# Patient Record
Sex: Female | Born: 1970 | Race: White | Hispanic: No | Marital: Married | Smoking: Never smoker
Health system: Southern US, Community
[De-identification: ages and names within clinical notes are randomized; demographics above are authoritative.]

## PROBLEM LIST (undated history)

## (undated) DIAGNOSIS — K2281 Esophageal polyp: Secondary | ICD-10-CM

## (undated) DIAGNOSIS — Z8719 Personal history of other diseases of the digestive system: Secondary | ICD-10-CM

## (undated) DIAGNOSIS — K228 Other specified diseases of esophagus: Secondary | ICD-10-CM

## (undated) DIAGNOSIS — Z86010 Personal history of colonic polyps: Secondary | ICD-10-CM

## (undated) HISTORY — DX: Other specified diseases of esophagus: K22.8

## (undated) HISTORY — PX: ENDOMETRIAL ABLATION: SHX621

## (undated) HISTORY — DX: Personal history of colonic polyps: Z86.010

## (undated) HISTORY — PX: NASAL SINUS SURGERY: SHX719

## (undated) HISTORY — DX: Esophageal polyp: K22.81

## (undated) HISTORY — DX: Personal history of other diseases of the digestive system: Z87.19

---

## 2004-12-11 ENCOUNTER — Emergency Department (HOSPITAL_COMMUNITY): Admission: EM | Admit: 2004-12-11 | Discharge: 2004-12-12 | Payer: Self-pay | Admitting: Emergency Medicine

## 2006-02-25 ENCOUNTER — Other Ambulatory Visit: Admission: RE | Admit: 2006-02-25 | Discharge: 2006-02-25 | Payer: Self-pay | Admitting: Obstetrics and Gynecology

## 2007-02-02 ENCOUNTER — Encounter: Admission: RE | Admit: 2007-02-02 | Discharge: 2007-02-02 | Payer: Self-pay | Admitting: Gastroenterology

## 2008-05-31 ENCOUNTER — Other Ambulatory Visit: Admission: RE | Admit: 2008-05-31 | Discharge: 2008-05-31 | Payer: Self-pay | Admitting: Obstetrics and Gynecology

## 2008-10-18 ENCOUNTER — Ambulatory Visit (HOSPITAL_COMMUNITY): Admission: RE | Admit: 2008-10-18 | Discharge: 2008-10-18 | Payer: Self-pay | Admitting: Internal Medicine

## 2009-06-16 ENCOUNTER — Other Ambulatory Visit: Admission: RE | Admit: 2009-06-16 | Discharge: 2009-06-16 | Payer: Self-pay | Admitting: Obstetrics and Gynecology

## 2010-05-13 ENCOUNTER — Encounter: Payer: Self-pay | Admitting: Obstetrics and Gynecology

## 2010-06-18 ENCOUNTER — Other Ambulatory Visit (HOSPITAL_COMMUNITY)
Admission: RE | Admit: 2010-06-18 | Discharge: 2010-06-18 | Disposition: A | Discharge status: Home / Self Care | Payer: BC Managed Care – PPO | Source: Ambulatory Visit | Attending: Obstetrics and Gynecology | Admitting: Obstetrics and Gynecology

## 2010-06-18 DIAGNOSIS — Z01419 Encounter for gynecological examination (general) (routine) without abnormal findings: Secondary | ICD-10-CM | POA: Insufficient documentation

## 2011-06-19 ENCOUNTER — Other Ambulatory Visit (HOSPITAL_COMMUNITY)
Admission: RE | Admit: 2011-06-19 | Discharge: 2011-06-19 | Disposition: A | Discharge status: Home / Self Care | Payer: BC Managed Care – PPO | Source: Ambulatory Visit | Attending: Obstetrics and Gynecology | Admitting: Obstetrics and Gynecology

## 2011-06-19 DIAGNOSIS — Z01419 Encounter for gynecological examination (general) (routine) without abnormal findings: Secondary | ICD-10-CM | POA: Insufficient documentation

## 2012-10-15 ENCOUNTER — Other Ambulatory Visit (HOSPITAL_COMMUNITY)
Admission: RE | Admit: 2012-10-15 | Discharge: 2012-10-15 | Disposition: A | Discharge status: Home / Self Care | Payer: BC Managed Care – PPO | Source: Ambulatory Visit | Attending: Obstetrics and Gynecology | Admitting: Obstetrics and Gynecology

## 2012-10-15 ENCOUNTER — Other Ambulatory Visit: Payer: Self-pay | Admitting: Obstetrics and Gynecology

## 2012-10-15 DIAGNOSIS — Z1151 Encounter for screening for human papillomavirus (HPV): Secondary | ICD-10-CM | POA: Insufficient documentation

## 2012-10-15 DIAGNOSIS — Z01419 Encounter for gynecological examination (general) (routine) without abnormal findings: Secondary | ICD-10-CM | POA: Insufficient documentation

## 2012-11-18 ENCOUNTER — Ambulatory Visit (INDEPENDENT_AMBULATORY_CARE_PROVIDER_SITE_OTHER): Payer: BC Managed Care – PPO | Admitting: Physician Assistant

## 2012-11-18 ENCOUNTER — Encounter: Payer: Self-pay | Admitting: Physician Assistant

## 2012-11-18 VITALS — BP 124/86 | HR 76 | Temp 98.0°F | Resp 18 | Ht 69.5 in | Wt 193.0 lb

## 2012-11-18 DIAGNOSIS — F411 Generalized anxiety disorder: Secondary | ICD-10-CM

## 2012-11-18 DIAGNOSIS — R1013 Epigastric pain: Secondary | ICD-10-CM

## 2012-11-18 LAB — CBC WITH DIFFERENTIAL/PLATELET
Basophils Absolute: 0 10*3/uL (ref 0.0–0.1)
Basophils Relative: 0 % (ref 0–1)
Eosinophils Absolute: 0 10*3/uL (ref 0.0–0.7)
HCT: 42 % (ref 36.0–46.0)
Lymphs Abs: 1.9 10*3/uL (ref 0.7–4.0)
MCHC: 34.8 g/dL (ref 30.0–36.0)
Monocytes Absolute: 0.4 10*3/uL (ref 0.1–1.0)
Neutrophils Relative %: 70 % (ref 43–77)
RBC: 4.94 MIL/uL (ref 3.87–5.11)
RDW: 13.6 % (ref 11.5–15.5)

## 2012-11-18 LAB — AMYLASE: Amylase: 43 U/L (ref 0–105)

## 2012-11-18 LAB — COMPLETE METABOLIC PANEL WITH GFR
AST: 15 U/L (ref 0–37)
BUN: 6 mg/dL (ref 6–23)
Calcium: 9.3 mg/dL (ref 8.4–10.5)
Sodium: 138 mEq/L (ref 135–145)
Total Protein: 6.8 g/dL (ref 6.0–8.3)

## 2012-11-18 MED ORDER — ALPRAZOLAM 0.5 MG PO TABS: 0.5000 mg | ORAL_TABLET | Freq: Two times a day (BID) | ORAL | Status: DC

## 2012-11-18 MED ORDER — OMEPRAZOLE 40 MG PO CPDR: 40.0000 mg | DELAYED_RELEASE_CAPSULE | Freq: Every day | ORAL | Status: DC

## 2012-11-18 NOTE — Progress Notes (Signed)
Patient ID: Julia Wu MRN: [id number], DOB: 08/31/1970, 42 y.o. Date of Encounter: @DATE @  Chief Complaint:  Chief Complaint  Patient presents with  . stomach issues  alot of pain, loose bowels    x 2 weeks  and no appetite    HPI: 42 y.o. year old white (very tanned today!) female  presents with c/o abdominal pain.   Says started having some "issue" 3 weeks ago-stools have been loose-not diarrhea, but just loose. Then, for past 2 weeks, has had constant pain in epigastric area. Says it feels "raw in there".Also describes as "burning pain".  Pain is constant and does not get worse before or after she eats. Getting worse over past week.  No "heartburn" in chest. No feeling/tasting acid in throat. No vomit. Occasional nausea. Has decrased eating sec to pain even though food does not worsen the pain. No melena, hematochezia. LOV with GI was 3 yr ago-had colonoscopy then and maybe another EGD then too-pt not sure-sees [facility]  Has had high stress at home-does not go into details. Says years ago went through "rough time" and "doctors gave her daily meds-did not agree with her"-does not want SSRI. Has old bottle of Xanax Rxed 5 years ago-says she would like refill to have to use prn.  Also running 3 miles a day.  Has used NSAID about 2 per week but no more than that.  Has been drinking NO alcohol.    Past Medical History  Diagnosis Date  . History of colitis   . Hx of colonic polyps   . Esophageal polyp      Home Meds: See attached medication section for current medication list. Any medications entered into computer today will not appear on this note's list. The medications listed below were entered prior to today. No current outpatient prescriptions on file prior to visit.   No current facility-administered medications on file prior to visit.    Allergies:  Allergies  Allergen Reactions  . Penicillins Shortness Of Breath and Swelling  . Sulfa Antibiotics Hives    History    Social History  . Marital Status: Married    Spouse Name: N/A    Number of Children: N/A  . Years of Education: N/A   Occupational History  . Not on file.   Social History Main Topics  . Smoking status: Never Smoker   . Smokeless tobacco: Never Used  . Alcohol Use: No  . Drug Use: No  . Sexually Active: Not on file   Other Topics Concern  . Not on file   Social History Narrative  . No narrative on file    Family History  Problem Relation Age of Onset  . Heart disease Sister     pacemaker     Review of Systems:  See HPI for pertinent ROS. All other ROS negative.    Physical Exam: Blood pressure 124/86, pulse 76, temperature 98 F (36.7 C), temperature source Oral, resp. rate 18, height 5' 9.5" (1.765 m), weight 193 lb (87.544 kg), last menstrual period 09/18/2012., Body mass index is 28.1 kg/(m^2). General: Very Tanned today!! WF Appears in no acute distress.Appears in NO pain. Lungs: Clear bilaterally to auscultation without wheezes, rales, or rhonchi. Breathing is unlabored. Heart: RRR with S1 S2. No murmurs, rubs, or gallops. Abdomen: Soft,  non-distended with normoactive bowel sounds. No hepatomegaly. No rebound/guarding. No obvious abdominal masses. Tender to palpation midline from umbilicus to epigastric region. No Tenderness with palpation RUQ at all. Not even  right of midline whatsoever.  Musculoskeletal:  Strength and tone normal for age. Extremities/Skin: Warm and dry. No clubbing or cyanosis. No edema. No rashes or suspicious lesions. Neuro: Alert and oriented X 3. Moves all extremities spontaneously. Gait is normal. CNII-XII grossly in tact. Psych:  Responds to questions appropriately with a normal affect.     ASSESSMENT AND PLAN:  42 y.o. year old female with  1. Abdominal pain, epigastric Avoid all Nsaids. Stay off alcohol. Start omeprazole. Check labs.  Take Xanax to reduce stress, as this may be underlying cause of her symptoms. Symptoms c/w  gastritis/duodenitis, could even be ulcer. Start PPI, xanax, check labs. May need f/u with GI if symptoms not improved in 1-2 weeks. - omeprazole (PRILOSEC) 40 MG capsule; Take 1 capsule (40 mg total) by mouth daily.  Dispense: 30 capsule; Refill: 3 - CBC with Differential - COMPLETE METABOLIC PANEL WITH GFR - Amylase - Lipase - Helicobacter pylori abs-IgG+IgA, bld - Celiac panel 10  2. Anxiety state, unspecified - ALPRAZolam (XANAX) 0.5 MG tablet; Take 1 tablet (0.5 mg total) by mouth 2 (two) times daily.  Dispense: 60 tablet; Refill: 0   Signed, [address] [place], [place], [facility] 11/18/2012 9:55 AM

## 2012-11-19 LAB — CELIAC PANEL 10
Endomysial Screen: NEGATIVE
Gliadin IgG: 4.9 U/mL (ref <20)
IgA: 59 mg/dL — ABNORMAL LOW (ref 69–380)

## 2012-11-20 ENCOUNTER — Telehealth: Payer: Self-pay | Admitting: Family Medicine

## 2012-11-20 LAB — HELICOBACTER PYLORI ABS-IGG+IGA, BLD
H Pylori IgG: <0.4 {ISR}
HELICOBACTER PYLORI AB, IGA: 0.3 U/mL (ref <9.0)

## 2012-11-20 NOTE — Telephone Encounter (Signed)
Pt aware of normal labs.  States feeling much better on medications started.

## 2012-11-20 NOTE — Telephone Encounter (Signed)
Message copied by Donne Anon on Fri Nov 20, 2012  3:37 PM ------      Message from: Allayne Butcher      Created: Fri Nov 20, 2012 12:00 PM       See OV note-Pt c/o pain for 3 weeks-on exam, had pain with palpation midline from umbilicus to epigastrium. She described as "raw" pain. Sounds like gastritis/duodenitis. Also was having high stress. I Rxed omeprazole and xanax.       Tell her all labs normal. Find out if symptoms any better with addition of meds. If better, just cont to monitor. If no better, would refer to GI. I think she needs EGD. I think this would be more likely to show a diagnosis than a CT. ------

## 2013-01-18 ENCOUNTER — Ambulatory Visit (INDEPENDENT_AMBULATORY_CARE_PROVIDER_SITE_OTHER): Payer: BC Managed Care – PPO | Admitting: *Deleted

## 2013-01-18 DIAGNOSIS — Z111 Encounter for screening for respiratory tuberculosis: Secondary | ICD-10-CM

## 2013-01-20 ENCOUNTER — Ambulatory Visit: Payer: BC Managed Care – PPO | Admitting: Family Medicine

## 2013-01-20 DIAGNOSIS — Z111 Encounter for screening for respiratory tuberculosis: Secondary | ICD-10-CM

## 2013-01-20 LAB — TB SKIN TEST
Induration: 0 mm
TB Skin Test: NEGATIVE

## 2013-01-20 NOTE — Progress Notes (Signed)
Patient ID: Julia Wu, female   DOB: 1970/07/22, 42 y.o.   MRN: [id number] Patient here to have TB Skin Test read.  Patient had no reaction to PPD Skin test.  Patient given result report.

## 2013-02-02 ENCOUNTER — Telehealth: Payer: Self-pay | Admitting: Physician Assistant

## 2013-02-02 DIAGNOSIS — F411 Generalized anxiety disorder: Secondary | ICD-10-CM

## 2013-02-02 NOTE — Telephone Encounter (Addendum)
Patient needs a refill on her Xanax  O.5 mg . Patient has made appointment to be seen on Friday Oct 17 , 2014.  Patient ran out today.

## 2013-02-02 NOTE — Telephone Encounter (Signed)
Ok to refill 

## 2013-02-03 MED ORDER — ALPRAZOLAM 0.5 MG PO TABS: 0.5000 mg | ORAL_TABLET | Freq: Two times a day (BID) | ORAL | Status: DC

## 2013-02-03 NOTE — Telephone Encounter (Signed)
rx called in

## 2013-02-03 NOTE — Telephone Encounter (Signed)
Approved for one refill.

## 2013-02-05 ENCOUNTER — Ambulatory Visit (INDEPENDENT_AMBULATORY_CARE_PROVIDER_SITE_OTHER): Payer: BC Managed Care – PPO | Admitting: Family Medicine

## 2013-02-05 VITALS — BP 128/70 | HR 78 | Temp 98.0°F | Resp 20 | Wt 186.0 lb

## 2013-02-05 DIAGNOSIS — F411 Generalized anxiety disorder: Secondary | ICD-10-CM

## 2013-02-05 DIAGNOSIS — F329 Major depressive disorder, single episode, unspecified: Secondary | ICD-10-CM

## 2013-02-05 DIAGNOSIS — Z23 Encounter for immunization: Secondary | ICD-10-CM

## 2013-02-05 MED ORDER — ESCITALOPRAM OXALATE 10 MG PO TABS: 10.0000 mg | ORAL_TABLET | Freq: Every day | ORAL | Status: DC

## 2013-02-05 NOTE — Patient Instructions (Signed)
Start the lexapro once a day  Continue xanax as needed   F/U 4 weeks

## 2013-02-07 ENCOUNTER — Encounter: Payer: Self-pay | Admitting: Family Medicine

## 2013-02-07 DIAGNOSIS — F411 Generalized anxiety disorder: Secondary | ICD-10-CM | POA: Insufficient documentation

## 2013-02-07 DIAGNOSIS — F329 Major depressive disorder, single episode, unspecified: Secondary | ICD-10-CM | POA: Insufficient documentation

## 2013-02-07 NOTE — Assessment & Plan Note (Signed)
STart lexapro 10mg , Discussed medication and side effects

## 2013-02-07 NOTE — Progress Notes (Signed)
  Subjective:    Patient ID: Julia Wu, female    DOB: [birth year], 42 y.o.   MRN: [id number]  HPI   Pt here with stress and depression symptoms. Seen by PCP a few months ago, having increased stress due to pending divorce from husband. 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 She is not sleeping well, finds herself crying over little things. Her mind races at night and her sleep is poor. She continues to exercise/run this helps her stress levels, also has supportive family. She has been using xanax very sparingly, she is worried about overuse and becoming addicted to this medication and decided to come in try something a little safer for her depression and anxiety symptoms.   Review of Systems - per above  GEN- denies fatigue, fever, weight loss,weakness, recent illness CVS- denies chest pain, palpitations RESP- denies SOB, cough, wheeze Neuro- denies headache, dizziness, syncope, seizure activity       Objective:   Physical Exam  GEN-NAD alert and oriented x 3 Psych- normal affect and mood, not overly depressed, well groomed, good eye contact, no apparent hallucinations, normal speech, no SI      Assessment & Plan:

## 2013-02-07 NOTE — Assessment & Plan Note (Signed)
STart lexapro 10mg , Discussed medication and side effects Okay to overlap with benzo, will plan to decrease use of benzo and pt agrees Continue exercise and healthy diet

## 2013-02-16 ENCOUNTER — Telehealth: Payer: Self-pay | Admitting: Physician Assistant

## 2013-02-16 NOTE — Telephone Encounter (Signed)
Patient needs her most recent TB test results .    [facility].

## 2013-04-29 ENCOUNTER — Ambulatory Visit (INDEPENDENT_AMBULATORY_CARE_PROVIDER_SITE_OTHER): Payer: BC Managed Care – PPO | Admitting: Physician Assistant

## 2013-04-29 ENCOUNTER — Encounter: Payer: Self-pay | Admitting: Physician Assistant

## 2013-04-29 VITALS — BP 134/92 | HR 68 | Temp 98.3°F | Resp 18 | Wt 181.0 lb

## 2013-04-29 DIAGNOSIS — B9689 Other specified bacterial agents as the cause of diseases classified elsewhere: Secondary | ICD-10-CM

## 2013-04-29 DIAGNOSIS — F411 Generalized anxiety disorder: Secondary | ICD-10-CM

## 2013-04-29 DIAGNOSIS — J988 Other specified respiratory disorders: Secondary | ICD-10-CM

## 2013-04-29 DIAGNOSIS — F329 Major depressive disorder, single episode, unspecified: Secondary | ICD-10-CM

## 2013-04-29 DIAGNOSIS — A499 Bacterial infection, unspecified: Secondary | ICD-10-CM

## 2013-04-29 MED ORDER — AZITHROMYCIN 250 MG PO TABS: ORAL_TABLET | ORAL | Status: DC

## 2013-04-29 MED ORDER — SERTRALINE HCL 50 MG PO TABS: 50.0000 mg | ORAL_TABLET | Freq: Every day | ORAL | Status: DC

## 2013-04-29 MED ORDER — ALPRAZOLAM 0.5 MG PO TABS: 0.5000 mg | ORAL_TABLET | Freq: Two times a day (BID) | ORAL | Status: DC

## 2013-04-29 NOTE — Progress Notes (Signed)
Patient ID: Julia Wu MRN: [id number], DOB: Aug 21, 1970, 43 y.o. Date of Encounter: @DATE @  Chief Complaint:  Chief Complaint  Patient presents with  . chronic sinus problem  . Medication Refill    HPI: 43 y.o. year old white female  presents with:  She says that she's been sick for at least 2 weeks now. Says it started with some sore throat and "cold symptoms". Says she was getting a lot of drainage out of her nose. Says now nothing is coming out that she is now having a lot of pressure in her sinuses. Also the sore throat his back. Says that she did have some congestion in her chest but that is resolved. She's had no been fever. Does a lot of sinus pain and pressure.  As well, she says that she has been going through a lot of stress and problems at home. She says that she had seen me about this in the past and was using Xanax as needed. She says that this definitely helps but she realized that she was going to need something more long-term and did not want to keep using so much Xanax. Says that she ended up coming in to be seen on a diet I was not here and saw Dr. Jeanice Lim. Prescribed Lexapro 10 mg. Patient says that she took this for about 2 weeks and then had to stop it. She says that it caused her "to feel like her brain was busy." Says that she "did not feel normal".  Says that she has never been on any other SSRI.  She is out of Xanax and needs a refill. Says that these help but that she still knows that she needs to get on some other type of medicine to use more long-term.     Past Medical History  Diagnosis Date  . History of colitis   . Hx of colonic polyps   . Esophageal polyp      Home Meds: See attached medication section for current medication list. Any medications entered into computer today will not appear on this note's list. The medications listed below were entered prior to today. Current Outpatient Prescriptions on File Prior to Visit  Medication Sig Dispense Refill   . MICROGESTIN 1-20 MG-MCG tablet Take 1 tablet by mouth daily.      Marland Kitchen omeprazole (PRILOSEC) 40 MG capsule Take 1 capsule (40 mg total) by mouth daily.  30 capsule  3   No current facility-administered medications on file prior to visit.    Allergies:  Allergies  Allergen Reactions  . Penicillins Shortness Of Breath and Swelling  . Lexapro [Escitalopram Oxalate]     Caused increased "racing in brain"  . Sulfa Antibiotics Hives    History   Social History  . Marital Status: Married    Spouse Name: N/A    Number of Children: N/A  . Years of Education: N/A   Occupational History  . Not on file.   Social History Main Topics  . Smoking status: Never Smoker   . Smokeless tobacco: Never Used  . Alcohol Use: No  . Drug Use: No  . Sexual Activity: Not on file   Other Topics Concern  . Not on file   Social History Narrative  . No narrative on file    Family History  Problem Relation Age of Onset  . Heart disease Sister     pacemaker     Review of Systems:  See HPI for pertinent ROS. All  other ROS negative.    Physical Exam: Blood pressure 134/92, pulse 68, temperature 98.3 F (36.8 C), temperature source Oral, resp. rate 18, weight 181 lb (82.101 kg)., Body mass index is 26.35 kg/(m^2). General: WNWD WF. Appears in no acute distress. Head: Normocephalic, atraumatic, eyes without discharge, sclera non-icteric, nares are without discharge. Bilateral auditory canals clear, TM's are without perforation, pearly grey and translucent with reflective cone of light bilaterally. Oral cavity moist, posterior pharynx without exudate, erythema, peritonsillar abscess. The lateral maxillary and frontal sinuses are tender with percussion. She says that the worst of the pressure is at the bridge of her nose.  Neck: Supple. No thyromegaly. No lymphadenopathy. Lungs: Clear bilaterally to auscultation without wheezes, rales, or rhonchi. Breathing is unlabored. Heart: RRR with S1 S2. No  murmurs, rubs, or gallops. Musculoskeletal:  Strength and tone normal for age. Extremities/Skin: Warm and dry. No clubbing or cyanosis. No edema. No rashes or suspicious lesions. Neuro: Alert and oriented X 3. Moves all extremities spontaneously. Gait is normal. CNII-XII grossly in tact. Psych:  Responds to questions appropriately with a normal affect. She is very pleasant and has a normal mood andaffect today during visit.     ASSESSMENT AND PLAN:  43 y.o. year old female with  1. GAD (generalized anxiety disorder) - sertraline (ZOLOFT) 50 MG tablet; Take 1 tablet (50 mg total) by mouth daily.  Dispense: 30 tablet; Refill: 3  2. Major depressive disorder, single episode - sertraline (ZOLOFT) 50 MG tablet; Take 1 tablet (50 mg total) by mouth daily.  Dispense: 30 tablet; Refill: 3  3. Anxiety state, unspecified - ALPRAZolam (XANAX) 0.5 MG tablet; Take 1 tablet (0.5 mg total) by mouth 2 (two) times daily.  Dispense: 60 tablet; Refill: 0  4. Bacterial respiratory infection He has allergy to penicillin and sulfa. She says that azithromycin usually does work well for her. - azithromycin (ZITHROMAX) 250 MG tablet; Day 1: take 2 daily.  Days 2-5: Take 1 daily.  Dispense: 6 tablet; Refill: 0  Discuss that, unfortunately, it is trial and error to determine which SSRI works well/which one does not cause adverse effects for each person.  Also discussed again that it takes weeks for the medicine to build up in system to take effect. She is to schedule followup office visit with me in 6 weeks. If she develops any adverse affects to the Zoloft, then  she should go ahead and call me immediately. Otherwise, if there are no adverse effects and she is to continue taking the medication daily until her followup visit in 6 weeks. Continue to use Xanax as needed in the interim. She knows to limit this medication as much as possible.  F/u immediately if her mood or symptoms worsen or if there is any adverse  effects.  [address]igned, Mary Beth [place], [place], [facility] 04/29/2013 3:51 PM

## 2013-06-09 ENCOUNTER — Ambulatory Visit: Payer: BC Managed Care – PPO | Admitting: Physician Assistant

## 2013-07-09 ENCOUNTER — Other Ambulatory Visit: Payer: Self-pay | Admitting: Physician Assistant

## 2013-07-09 DIAGNOSIS — F411 Generalized anxiety disorder: Secondary | ICD-10-CM

## 2013-07-09 MED ORDER — ALPRAZOLAM 0.5 MG PO TABS: 0.5000 mg | ORAL_TABLET | Freq: Two times a day (BID) | ORAL | Status: DC

## 2013-07-09 NOTE — Telephone Encounter (Signed)
Called in one additional refill.  Called patient and told this is last refill until makes follow up appt as instructed.

## 2013-07-09 NOTE — Telephone Encounter (Signed)
Agree. Approve.

## 2013-07-09 NOTE — Telephone Encounter (Signed)
Call back number is [phone] Pt is needing a refill on ALPRAZolam Julia Wu(XANAX) 0.5 MG tablet  Pharmacy [facility] [place] --Pt is wanting to know if she can get another refill till she is able to come in office

## 2013-09-15 ENCOUNTER — Ambulatory Visit: Payer: BC Managed Care – PPO | Admitting: Physician Assistant

## 2013-09-17 ENCOUNTER — Ambulatory Visit: Payer: BC Managed Care – PPO | Admitting: Family Medicine

## 2013-10-16 ENCOUNTER — Encounter (HOSPITAL_COMMUNITY): Payer: Self-pay | Admitting: Emergency Medicine

## 2013-10-16 ENCOUNTER — Emergency Department (HOSPITAL_COMMUNITY): Payer: Worker's Compensation

## 2013-10-16 ENCOUNTER — Emergency Department (HOSPITAL_COMMUNITY)
Admission: EM | Admit: 2013-10-16 | Discharge: 2013-10-16 | Disposition: A | Discharge status: Home / Self Care | Payer: Worker's Compensation | Attending: Emergency Medicine | Admitting: Emergency Medicine

## 2013-10-16 DIAGNOSIS — S0990XA Unspecified injury of head, initial encounter: Secondary | ICD-10-CM | POA: Diagnosis not present

## 2013-10-16 DIAGNOSIS — Z3202 Encounter for pregnancy test, result negative: Secondary | ICD-10-CM | POA: Diagnosis not present

## 2013-10-16 DIAGNOSIS — Y92410 Unspecified street and highway as the place of occurrence of the external cause: Secondary | ICD-10-CM | POA: Diagnosis not present

## 2013-10-16 DIAGNOSIS — Y9389 Activity, other specified: Secondary | ICD-10-CM | POA: Diagnosis not present

## 2013-10-16 DIAGNOSIS — Z860100 Personal history of colon polyps, unspecified: Secondary | ICD-10-CM | POA: Insufficient documentation

## 2013-10-16 DIAGNOSIS — S0993XA Unspecified injury of face, initial encounter: Secondary | ICD-10-CM | POA: Insufficient documentation

## 2013-10-16 DIAGNOSIS — Z8719 Personal history of other diseases of the digestive system: Secondary | ICD-10-CM | POA: Diagnosis not present

## 2013-10-16 DIAGNOSIS — S199XXA Unspecified injury of neck, initial encounter: Secondary | ICD-10-CM | POA: Diagnosis present

## 2013-10-16 LAB — POC URINE PREG, ED: Preg Test, Ur: NEGATIVE

## 2013-10-16 MED ORDER — HYDROCODONE-ACETAMINOPHEN 5-325 MG PO TABS: 2.0000 | ORAL_TABLET | ORAL | Status: DC | PRN
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
## 2013-10-16 NOTE — Discharge Instructions (Signed)
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
## 2013-10-25 ENCOUNTER — Inpatient Hospital Stay: Payer: BC Managed Care – PPO | Admitting: Physician Assistant

## 2013-11-25 ENCOUNTER — Other Ambulatory Visit: Payer: Self-pay | Admitting: Obstetrics and Gynecology

## 2013-11-25 ENCOUNTER — Other Ambulatory Visit (HOSPITAL_COMMUNITY)
Admission: RE | Admit: 2013-11-25 | Discharge: 2013-11-25 | Disposition: A | Discharge status: Home / Self Care | Payer: BC Managed Care – PPO | Source: Ambulatory Visit | Attending: Obstetrics and Gynecology | Admitting: Obstetrics and Gynecology

## 2013-11-25 DIAGNOSIS — Z01419 Encounter for gynecological examination (general) (routine) without abnormal findings: Secondary | ICD-10-CM | POA: Insufficient documentation

## 2013-11-26 LAB — CYTOLOGY - PAP

## 2013-12-30 ENCOUNTER — Encounter: Payer: Self-pay | Admitting: Physician Assistant

## 2013-12-30 ENCOUNTER — Ambulatory Visit (INDEPENDENT_AMBULATORY_CARE_PROVIDER_SITE_OTHER): Payer: BC Managed Care – PPO | Admitting: Physician Assistant

## 2013-12-30 VITALS — BP 120/80 | HR 82 | Temp 97.9°F | Resp 18 | Wt 182.0 lb

## 2013-12-30 DIAGNOSIS — A499 Bacterial infection, unspecified: Secondary | ICD-10-CM

## 2013-12-30 DIAGNOSIS — J988 Other specified respiratory disorders: Secondary | ICD-10-CM

## 2013-12-30 DIAGNOSIS — J029 Acute pharyngitis, unspecified: Secondary | ICD-10-CM

## 2013-12-30 DIAGNOSIS — B9689 Other specified bacterial agents as the cause of diseases classified elsewhere: Secondary | ICD-10-CM

## 2013-12-30 LAB — RAPID STREP SCREEN (MED CTR MEBANE ONLY): STREPTOCOCCUS, GROUP A SCREEN (DIRECT): NEGATIVE

## 2013-12-30 MED ORDER — AZITHROMYCIN 250 MG PO TABS: ORAL_TABLET | ORAL | Status: DC

## 2013-12-30 NOTE — Progress Notes (Signed)
    Patient ID: Julia Wu MRN: [id number], DOB: February 20, 1971, 43 y.o. Date of Encounter: 12/30/2013, 3:16 PM    Chief Complaint:  Chief Complaint  Patient presents with  . Sore Throat     HPI: 43 y.o. year old white female reprts that she has had head and nasal congestion and thick mucus from her nose for over one week. Now her throat is feeling really sore. Has some cough at night but does not feel like there is any congestion in her chest. Has had no known fevers or chills.     Home Meds:   Outpatient Prescriptions Prior to Visit  Medication Sig Dispense Refill  . HYDROcodone-acetaminophen (NORCO/VICODIN) 5-325 MG per tablet Take 2 tablets by mouth every 4 (four) hours as needed for moderate pain or severe pain.  12 tablet  0  . ibuprofen (ADVIL,MOTRIN) 200 MG tablet Take 400 mg by mouth once as needed for mild pain or moderate pain.      Marland Kitchen MICROGESTIN 1-20 MG-MCG tablet Take 1 tablet by mouth at bedtime.        No facility-administered medications prior to visit.    Allergies:  Allergies  Allergen Reactions  . Penicillins Shortness Of Breath and Swelling  . Lexapro [Escitalopram Oxalate]     Caused increased "racing in brain"  . Sulfa Antibiotics Hives      Review of Systems: See HPI for pertinent ROS. All other ROS negative.    Physical Exam: Blood pressure 120/80, pulse 82, temperature 97.9 F (36.6 C), temperature source Oral, resp. rate 18, weight 182 lb (82.555 kg)., Body mass index is 25.4 kg/(m^2). General: WNWD WF.  Appears in no acute distress. HEENT: Normocephalic, atraumatic, eyes without discharge, sclera non-icteric, nares are without discharge. Bilateral auditory canals clear, TM's are without perforation, pearly grey and translucent with reflective cone of light bilaterally. Oral cavity moist, posterior pharynx without exudate, erythema, peritonsillar abscess. There is erythema and small (<0.5cm) ulcerative area on left side of throat--but this is not the  posterior pharynx and is not the tonsil area.  Neck: Supple. No thyromegaly. No lymphadenopathy. There is mild tenderness with palpation of the left neck but no distinctly palpable enlarged lymph nodes.  Lungs: Clear bilaterally to auscultation without wheezes, rales, or rhonchi. Breathing is unlabored. Heart: Regular rhythm. No murmurs, rubs, or gallops. Msk:  Strength and tone normal for age. Extremities/Skin: Warm and dry.  No rashes. Neuro: Alert and oriented X 3. Moves all extremities spontaneously. Gait is normal. CNII-XII grossly in tact. Psych:  Responds to questions appropriately with a normal affect.   Results for orders placed in visit on 12/30/13  RAPID STREP SCREEN      Result Value Ref Range   Source THROAT     Streptococcus, Group A Screen (Direct) NEG  NEGATIVE     ASSESSMENT AND PLAN:  43 y.o. year old female with  1. Bacterial respiratory infection She is allergic to penicillin and sulfa. Therefore will treat with azithromycin. She is to use over-the-counter lozenges and spray as well as Tylenol/Motrin as needed for pain relief.  She is to followup if symptoms worsen significantly or she develops fever or if symptoms do not resolve after completion of antibiotic. - azithromycin (ZITHROMAX) 250 MG tablet; Day 1: Take 2 daily. Days 2-5: Take 1 daily.  Dispense: 6 tablet; Refill: 0  2. Sore throat - Rapid Strep Screen   Signed, Shon Hale [place], [place], [facility] 12/30/2013 3:16 PM

## 2014-11-28 ENCOUNTER — Other Ambulatory Visit (HOSPITAL_COMMUNITY)
Admission: RE | Admit: 2014-11-28 | Discharge: 2014-11-28 | Disposition: A | Discharge status: Home / Self Care | Payer: BC Managed Care – PPO | Source: Ambulatory Visit | Attending: Obstetrics and Gynecology | Admitting: Obstetrics and Gynecology

## 2014-11-28 ENCOUNTER — Other Ambulatory Visit: Payer: Self-pay | Admitting: Obstetrics and Gynecology

## 2014-11-28 DIAGNOSIS — Z1151 Encounter for screening for human papillomavirus (HPV): Secondary | ICD-10-CM | POA: Diagnosis present

## 2014-11-28 DIAGNOSIS — Z01411 Encounter for gynecological examination (general) (routine) with abnormal findings: Secondary | ICD-10-CM | POA: Insufficient documentation

## 2014-11-28 DIAGNOSIS — R87810 Cervical high risk human papillomavirus (HPV) DNA test positive: Secondary | ICD-10-CM | POA: Diagnosis present

## 2014-11-29 LAB — CYTOLOGY - PAP

## 2015-02-08 ENCOUNTER — Encounter: Payer: Self-pay | Admitting: Family Medicine

## 2015-02-08 ENCOUNTER — Ambulatory Visit (INDEPENDENT_AMBULATORY_CARE_PROVIDER_SITE_OTHER): Payer: BC Managed Care – PPO | Admitting: Family Medicine

## 2015-02-08 VITALS — BP 118/72 | HR 76 | Temp 98.5°F | Resp 12 | Ht 69.0 in | Wt 189.0 lb

## 2015-02-08 DIAGNOSIS — F411 Generalized anxiety disorder: Secondary | ICD-10-CM

## 2015-02-08 DIAGNOSIS — Z23 Encounter for immunization: Secondary | ICD-10-CM | POA: Diagnosis not present

## 2015-02-08 MED ORDER — ALPRAZOLAM 0.5 MG PO TABS: 0.5000 mg | ORAL_TABLET | Freq: Two times a day (BID) | ORAL | Status: DC | PRN

## 2015-02-08 NOTE — Patient Instructions (Signed)
Flu shot given  Medication refilled F/U as needed

## 2015-02-08 NOTE — Progress Notes (Signed)
Patient ID: Julia Wu, female   DOB: 03/04/1971, 44 y.o.   MRN: [id number]   Subjective:    Patient ID: Julia LedgerWyndy Wu, female    DOB: 02/03/1971, 44 y.o.   MRN: [id number]  Patient presents for Anxiety  Pt here for refill on xanax and Flu shot. History of anxiety and depression, was tried on lexapro and zoloft in past but had reactions. She has been doing well over the past year, she only uses xanax as needed. She recently completed script which was given in March of 2015. She uses a few times a month. Her symptoms started after her divorce, the divorce is now settled but they are now going through the settlement and her anxiety has crept back up on her. She is sleeping well, eating well, very active.  Her children are also graduating and schedules aver busy being a single parent   GYN- Dr. Richardson Doppole does her physical and lab work which was recently normal   Review Of Systems:  GEN- denies fatigue, fever, weight loss,weakness, recent illness HEENT- denies eye drainage, change in vision, nasal discharge, CVS- denies chest pain, palpitations RESP- denies SOB, cough, wheeze ABD- denies N/V, change in stools, abd pain Neuro- denies headache, dizziness, syncope, seizure activity       Objective:    BP 118/72 mmHg  Pulse 76  Temp(Src) 98.5 F (36.9 C) (Oral)  Resp 12  Ht 5\' 9"  (1.753 m)  Wt 189 lb (85.73 kg)  BMI 27.90 kg/m2  LMP 01/22/2015 (Approximate) GEN- NAD, alert and oriented x3 CVS- RRR, no murmur RESP-CTAB Psych- normal affect and mood, well groomed, normal speech,          Assessment & Plan:      Problem List Items Addressed This Visit    GAD (generalized anxiety disorder) - Primary    Sparingly use of xanax, though now will have increased stress/anxious mood with financial settlement with ex-husband Will refill xanax. No sign of abuse of medication Approx 15 minutes spent with pt > 50% spent on counseling, medication discussion       Other Visit Diagnoses    Need for prophylactic vaccination and inoculation against influenza        Relevant Orders    Flu Vaccine QUAD 36+ mos PF IM (Fluarix & Fluzone Quad PF) (Completed)       Note: This dictation was prepared with Dragon dictation along with smaller phrase technology. Any transcriptional errors that result from this process are unintentional.

## 2015-02-08 NOTE — Assessment & Plan Note (Signed)
Sparingly use of xanax, though now will have increased stress/anxious mood with financial settlement with ex-husband Will refill xanax. No sign of abuse of medication Approx 15 minutes spent with pt > 50% spent on counseling, medication discussion

## 2015-04-06 ENCOUNTER — Ambulatory Visit (INDEPENDENT_AMBULATORY_CARE_PROVIDER_SITE_OTHER): Payer: BC Managed Care – PPO | Admitting: Physician Assistant

## 2015-04-06 ENCOUNTER — Encounter: Payer: Self-pay | Admitting: Physician Assistant

## 2015-04-06 VITALS — BP 118/74 | HR 68 | Temp 98.3°F | Resp 18 | Wt 188.0 lb

## 2015-04-06 DIAGNOSIS — J019 Acute sinusitis, unspecified: Secondary | ICD-10-CM | POA: Diagnosis not present

## 2015-04-06 MED ORDER — AZITHROMYCIN 250 MG PO TABS: ORAL_TABLET | ORAL | Status: DC

## 2015-04-06 MED ORDER — PREDNISONE 20 MG PO TABS: ORAL_TABLET | ORAL | Status: DC

## 2015-04-06 NOTE — Progress Notes (Signed)
    Patient ID: Julia Wu MRN: [id number], DOB: 08/24/1970, 44 y.o. Date of Encounter: 04/06/2015, 3:35 PM    Chief Complaint:  Chief Complaint  Patient presents with  . sick x 2 weeks    started as cold now sinus infection     HPI: 44 y.o. year old white female presents with above. Says that now it has gotten to the point that her teeth hurt, her ears hurt, her throat hurts, and she feels a lot of sinus pressure and is having a lot of thick mucus. Feels a little something in her chest but hasn't had a lot of deep cough. No fevers or chills.     Home Meds:   Outpatient Prescriptions Prior to Visit  Medication Sig Dispense Refill  . ALPRAZolam (XANAX) 0.5 MG tablet Take 1 tablet (0.5 mg total) by mouth 2 (two) times daily as needed for anxiety. 60 tablet 1  . ibuprofen (ADVIL,MOTRIN) 200 MG tablet Take 400 mg by mouth once as needed for mild pain or moderate pain.    Marland Kitchen. MICROGESTIN 1-20 MG-MCG tablet Take 1 tablet by mouth at bedtime.      No facility-administered medications prior to visit.    Allergies:  Allergies  Allergen Reactions  . Penicillins Shortness Of Breath and Swelling  . Lexapro [Escitalopram Oxalate]     Caused increased "racing in brain"  . Sulfa Antibiotics Hives      Review of Systems: See HPI for pertinent ROS. All other ROS negative.    Physical Exam: Blood pressure 118/74, pulse 68, temperature 98.3 F (36.8 C), temperature source Oral, resp. rate 18, weight 188 lb (85.276 kg)., Body mass index is 27.75 kg/(m^2). General:  WNWD WF. Appears in no acute distress. HEENT: Normocephalic, atraumatic, eyes without discharge, sclera non-icteric, nares are without discharge. Bilateral auditory canals clear, TM's are without perforation, pearly grey and translucent with reflective cone of light bilaterally. Oral cavity moist, posterior pharynx without exudate, erythema, peritonsillar abscess. Positive tenderness with percussion of bilateral frontal and  maxillary sinuses.  Neck: Supple. No thyromegaly. No lymphadenopathy. Lungs: Clear bilaterally to auscultation without wheezes, rales, or rhonchi. Breathing is unlabored. Heart: Regular rhythm. No murmurs, rubs, or gallops. Msk:  Strength and tone normal for age. Extremities/Skin: Warm and dry. Neuro: Alert and oriented X 3. Moves all extremities spontaneously. Gait is normal. CNII-XII grossly in tact. Psych:  Responds to questions appropriately with a normal affect.     ASSESSMENT AND PLAN:  44 y.o. year old female with  1. Acute sinusitis, recurrence not specified, unspecified location She has allergy to penicillin and sulfa. She is to take the antibiotic and prednisone as directed. Follow-up if symptoms do not resolve with completion of these medications. - azithromycin (ZITHROMAX) 250 MG tablet; Day 1: Take 2 daily.  Days 2-5: Take 1 daily.  Dispense: 6 tablet; Refill: 0 - predniSONE (DELTASONE) 20 MG tablet; Take 3 daily for 2 days, then 2 daily for 2 days, then 1 daily for 2 days.  Dispense: 12 tablet; Refill: 0   Signed, [address]Mary Beth HayforkDixon, [place], [facility] 04/06/2015 3:35 PM

## 2015-06-12 ENCOUNTER — Emergency Department (HOSPITAL_COMMUNITY): Payer: BC Managed Care – PPO

## 2015-06-12 ENCOUNTER — Encounter (HOSPITAL_COMMUNITY): Payer: Self-pay

## 2015-06-12 ENCOUNTER — Emergency Department (HOSPITAL_COMMUNITY)
Admission: EM | Admit: 2015-06-12 | Discharge: 2015-06-12 | Disposition: A | Discharge status: Home / Self Care | Payer: BC Managed Care – PPO | Attending: Emergency Medicine | Admitting: Emergency Medicine

## 2015-06-12 DIAGNOSIS — S29001A Unspecified injury of muscle and tendon of front wall of thorax, initial encounter: Secondary | ICD-10-CM | POA: Diagnosis present

## 2015-06-12 DIAGNOSIS — S0990XA Unspecified injury of head, initial encounter: Secondary | ICD-10-CM | POA: Diagnosis not present

## 2015-06-12 DIAGNOSIS — Y9389 Activity, other specified: Secondary | ICD-10-CM | POA: Insufficient documentation

## 2015-06-12 DIAGNOSIS — Z880 Allergy status to penicillin: Secondary | ICD-10-CM | POA: Diagnosis not present

## 2015-06-12 DIAGNOSIS — S199XXA Unspecified injury of neck, initial encounter: Secondary | ICD-10-CM | POA: Diagnosis not present

## 2015-06-12 DIAGNOSIS — S3991XA Unspecified injury of abdomen, initial encounter: Secondary | ICD-10-CM | POA: Diagnosis not present

## 2015-06-12 DIAGNOSIS — Z8719 Personal history of other diseases of the digestive system: Secondary | ICD-10-CM | POA: Insufficient documentation

## 2015-06-12 DIAGNOSIS — T148 Other injury of unspecified body region: Secondary | ICD-10-CM | POA: Diagnosis not present

## 2015-06-12 DIAGNOSIS — Y998 Other external cause status: Secondary | ICD-10-CM | POA: Diagnosis not present

## 2015-06-12 DIAGNOSIS — Z86010 Personal history of colonic polyps: Secondary | ICD-10-CM | POA: Insufficient documentation

## 2015-06-12 DIAGNOSIS — T07XXXA Unspecified multiple injuries, initial encounter: Secondary | ICD-10-CM

## 2015-06-12 DIAGNOSIS — S8992XA Unspecified injury of left lower leg, initial encounter: Secondary | ICD-10-CM | POA: Insufficient documentation

## 2015-06-12 DIAGNOSIS — Y92410 Unspecified street and highway as the place of occurrence of the external cause: Secondary | ICD-10-CM | POA: Insufficient documentation

## 2015-06-12 MED ORDER — IBUPROFEN 800 MG PO TABS: 800.0000 mg | ORAL_TABLET | Freq: Three times a day (TID) | ORAL | Status: AC

## 2015-06-12 MED ORDER — CYCLOBENZAPRINE HCL 10 MG PO TABS: 10.0000 mg | ORAL_TABLET | Freq: Two times a day (BID) | ORAL | Status: AC | PRN

## 2015-06-12 MED ORDER — IBUPROFEN 800 MG PO TABS
800.0000 mg | ORAL_TABLET | Freq: Once | ORAL | Status: AC
  Administered 2015-06-12: 800 mg via ORAL
  Filled 2015-06-12: qty 1

## 2015-06-12 MED ORDER — HYDROCODONE-ACETAMINOPHEN 5-325 MG PO TABS: 1.0000 | ORAL_TABLET | ORAL | Status: AC | PRN

## 2015-06-12 MED ORDER — CYCLOBENZAPRINE HCL 10 MG PO TABS
10.0000 mg | ORAL_TABLET | Freq: Once | ORAL | Status: AC
  Administered 2015-06-12: 10 mg via ORAL
  Filled 2015-06-12: qty 1

## 2015-06-12 MED ORDER — HYDROCODONE-ACETAMINOPHEN 5-325 MG PO TABS
2.0000 | ORAL_TABLET | Freq: Once | ORAL | Status: AC
  Administered 2015-06-12: 2 via ORAL
  Filled 2015-06-12: qty 2

## 2015-06-12 MED ORDER — IOHEXOL 300 MG/ML  SOLN
100.0000 mL | Freq: Once | INTRAMUSCULAR | Status: AC | PRN
  Administered 2015-06-12: 100 mL via INTRAVENOUS
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
## 2015-06-12 NOTE — ED Notes (Signed)
Patient given discharge instruction, verbalized understand. IV removed, band aid applied. Patient ambulatory out of the department.  

## 2015-06-12 NOTE — ED Notes (Signed)
Pt removed from back board by EDP 

## 2015-06-12 NOTE — Discharge Instructions (Signed)
Expect to be stiff and sore for the next several days.  Ice to sore spots for first 48 hours, then heat or massage as needed.
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
## 2015-06-21 ENCOUNTER — Ambulatory Visit (INDEPENDENT_AMBULATORY_CARE_PROVIDER_SITE_OTHER): Payer: BC Managed Care – PPO | Admitting: Physician Assistant

## 2015-06-21 ENCOUNTER — Encounter: Payer: Self-pay | Admitting: Physician Assistant

## 2015-06-21 VITALS — BP 110/80 | HR 82 | Temp 98.4°F | Resp 18 | Wt 188.0 lb

## 2015-06-21 DIAGNOSIS — S60222D Contusion of left hand, subsequent encounter: Secondary | ICD-10-CM | POA: Diagnosis not present
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
## 2015-08-30 ENCOUNTER — Other Ambulatory Visit: Payer: Self-pay | Admitting: Physician Assistant

## 2015-08-30 NOTE — Telephone Encounter (Signed)
Approved. #[id number]. 

## 2015-08-30 NOTE — Telephone Encounter (Signed)
Patient needs refill on xanax if possible to [facility] in [place]  [phone]

## 2015-08-30 NOTE — Telephone Encounter (Signed)
Ok to refill??      LRF - 02/08/15 #60/1    LOV - for GAD - 02/08/15  LOV - 06/21/15

## 2015-08-31 ENCOUNTER — Other Ambulatory Visit (HOSPITAL_COMMUNITY)
Admission: RE | Admit: 2015-08-31 | Discharge: 2015-08-31 | Disposition: A | Discharge status: Home / Self Care | Payer: BC Managed Care – PPO | Source: Ambulatory Visit | Attending: Obstetrics and Gynecology | Admitting: Obstetrics and Gynecology

## 2015-08-31 ENCOUNTER — Other Ambulatory Visit: Payer: Self-pay | Admitting: Obstetrics and Gynecology

## 2015-08-31 DIAGNOSIS — Z01419 Encounter for gynecological examination (general) (routine) without abnormal findings: Secondary | ICD-10-CM | POA: Insufficient documentation

## 2015-08-31 MED ORDER — ALPRAZOLAM 0.5 MG PO TABS: 0.5000 mg | ORAL_TABLET | Freq: Two times a day (BID) | ORAL | Status: AC | PRN

## 2015-08-31 NOTE — Telephone Encounter (Signed)
Medication called to pharmacy. 

## 2015-09-04 LAB — CYTOLOGY - PAP

## 2015-11-30 ENCOUNTER — Other Ambulatory Visit (HOSPITAL_COMMUNITY)
Admission: RE | Admit: 2015-11-30 | Discharge: 2015-11-30 | Disposition: A | Discharge status: Home / Self Care | Payer: BC Managed Care – PPO | Source: Ambulatory Visit | Attending: Obstetrics and Gynecology | Admitting: Obstetrics and Gynecology

## 2015-11-30 ENCOUNTER — Other Ambulatory Visit: Payer: Self-pay | Admitting: Obstetrics and Gynecology

## 2015-11-30 DIAGNOSIS — Z01419 Encounter for gynecological examination (general) (routine) without abnormal findings: Secondary | ICD-10-CM | POA: Insufficient documentation

## 2015-12-04 LAB — CYTOLOGY - PAP
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
